# Patient Record
Sex: Male | Born: 1980
Health system: Southern US, Community
[De-identification: ages and names within clinical notes are randomized; demographics above are authoritative.]

## PROBLEM LIST (undated history)

## (undated) DIAGNOSIS — F419 Anxiety disorder, unspecified: Secondary | ICD-10-CM

## (undated) DIAGNOSIS — T7840XA Allergy, unspecified, initial encounter: Secondary | ICD-10-CM

## (undated) HISTORY — DX: Allergy, unspecified, initial encounter: T78.40XA

## (undated) HISTORY — PX: SPINE SURGERY: SHX786

## (undated) HISTORY — DX: Anxiety disorder, unspecified: F41.9

---

## 2003-04-15 ENCOUNTER — Emergency Department (HOSPITAL_COMMUNITY): Admission: EM | Admit: 2003-04-15 | Discharge: 2003-04-16 | Payer: Self-pay | Admitting: Emergency Medicine

## 2004-04-02 HISTORY — PX: OTHER SURGICAL HISTORY: SHX169

## 2011-05-19 ENCOUNTER — Ambulatory Visit (INDEPENDENT_AMBULATORY_CARE_PROVIDER_SITE_OTHER): Payer: 59 | Admitting: Internal Medicine

## 2011-05-19 DIAGNOSIS — R209 Unspecified disturbances of skin sensation: Secondary | ICD-10-CM

## 2011-05-19 DIAGNOSIS — R202 Paresthesia of skin: Secondary | ICD-10-CM

## 2011-05-19 DIAGNOSIS — M419 Scoliosis, unspecified: Secondary | ICD-10-CM
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
## 2012-02-29 ENCOUNTER — Encounter: Payer: Self-pay | Admitting: Emergency Medicine

## 2012-02-29 ENCOUNTER — Ambulatory Visit (INDEPENDENT_AMBULATORY_CARE_PROVIDER_SITE_OTHER): Payer: 59 | Admitting: Emergency Medicine

## 2012-02-29 VITALS — BP 135/77 | HR 65 | Temp 97.5°F | Resp 18 | Ht 71.5 in | Wt 156.0 lb

## 2012-02-29 DIAGNOSIS — J018 Other acute sinusitis: Secondary | ICD-10-CM

## 2012-02-29 MED ORDER — AMOXICILLIN-POT CLAVULANATE 875-125 MG PO TABS: 1.0000 | ORAL_TABLET | Freq: Two times a day (BID) | ORAL | Status: DC

## 2012-02-29 MED ORDER — PSEUDOEPHEDRINE-GUAIFENESIN ER 60-600 MG PO TB12: 1.0000 | ORAL_TABLET | Freq: Two times a day (BID) | ORAL | Status: DC

## 2012-02-29 NOTE — Progress Notes (Signed)
[facility] [address] [place] [address] [phone]  Date:  02/29/2012   Name:  Ryan Campos   DOB:  [birth year]   MRN:  [id number]  PCP:  No primary provider on file.    Chief Complaint: Sore Throat and Headache   History of Present Illness:  Ryan Campos is a 31 y.o. very pleasant male patient who presents with the following:  Ill for a week with a sore throat.  Developed pain in the ears and last night started to have a frontal headache that is much worse when he bends over, sneezes or coughs.  Has a green nasal drainage and a post nasal drip.  No cough or wheeze or shortness of breath.  Patient Active Problem List  Diagnosis  . Scoliosis deformity of spine    No past medical history on file.  Past Surgical History  Procedure Date  . Scoliosis 2006    rod    History  Substance Use Topics  . Smoking status: Never Smoker   . Smokeless tobacco: Not on file  . Alcohol Use: Not on file    No family history on file.  No Known Allergies  Medication list has been reviewed and updated.  No current outpatient prescriptions on file prior to visit.    Review of Systems:  As per HPI, otherwise negative.    Physical Examination: Filed Vitals:   02/29/12 1036  BP: 135/77  Pulse: 65  Temp: 97.5 F (36.4 C)  Resp: 18   Filed Vitals:   02/29/12 1036  Height: 5' 11.5" (1.816 m)  Weight: 156 lb (70.761 kg)   Body mass index is 21.45 kg/(m^2). Ideal Body Weight: Weight in (lb) to have BMI = 25: 181.4   GEN: WDWN, NAD, Non-toxic, A & O x 3.  No rash or sepsis or shortness of breath HEENT: Atraumatic, Normocephalic. Neck supple. No masses, No LAD.  Oropharynx negative Ears and Nose: No external deformity.  TM negative CV: RRR, No M/G/R. No JVD. No thrill. No extra heart sounds. PULM: CTA B, no wheezes, crackles, rhonchi. No retractions. No resp. distress. No accessory muscle use. ABD: S, NT, ND, +BS. No rebound. No HSM. EXTR: No  c/c/e NEURO Normal gait.  PSYCH: Normally interactive. Conversant. Not depressed or anxious appearing.  Calm demeanor.    Assessment and Plan: Sinusitis augmentin mucinex d Follow up as needed  Carmelina Dane, MD

## 2012-03-10 NOTE — Progress Notes (Signed)
Reviewed and agree.

## 2012-09-10 ENCOUNTER — Emergency Department (HOSPITAL_COMMUNITY)
Admission: EM | Admit: 2012-09-10 | Discharge: 2012-09-10 | Disposition: A | Discharge status: Home / Self Care | Payer: No Typology Code available for payment source | Attending: Emergency Medicine | Admitting: Emergency Medicine

## 2012-09-10 ENCOUNTER — Emergency Department (HOSPITAL_COMMUNITY): Payer: No Typology Code available for payment source

## 2012-09-10 ENCOUNTER — Encounter (HOSPITAL_COMMUNITY): Payer: Self-pay | Admitting: Physical Medicine and Rehabilitation

## 2012-09-10 DIAGNOSIS — S161XXA Strain of muscle, fascia and tendon at neck level, initial encounter: Secondary | ICD-10-CM

## 2012-09-10 DIAGNOSIS — Y92410 Unspecified street and highway as the place of occurrence of the external cause: Secondary | ICD-10-CM | POA: Insufficient documentation

## 2012-09-10 DIAGNOSIS — S20219A Contusion of unspecified front wall of thorax, initial encounter: Secondary | ICD-10-CM | POA: Insufficient documentation

## 2012-09-10 DIAGNOSIS — Y9389 Activity, other specified: Secondary | ICD-10-CM | POA: Insufficient documentation

## 2012-09-10 DIAGNOSIS — S20211A Contusion of right front wall of thorax, initial encounter: Secondary | ICD-10-CM

## 2012-09-10 DIAGNOSIS — S139XXA Sprain of joints and ligaments of unspecified parts of neck, initial encounter: Secondary | ICD-10-CM | POA: Insufficient documentation

## 2012-09-10 MED ORDER — CYCLOBENZAPRINE HCL 10 MG PO TABS: 10.0000 mg | ORAL_TABLET | Freq: Two times a day (BID) | ORAL | Status: DC | PRN

## 2012-09-10 MED ORDER — NAPROXEN 500 MG PO TABS: 500.0000 mg | ORAL_TABLET | Freq: Two times a day (BID) | ORAL | Status: DC

## 2012-09-10 NOTE — ED Notes (Signed)
c-collar applied in triage
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
## 2012-09-11 NOTE — ED Provider Notes (Signed)
Medical screening examination/treatment/procedure(s) were performed by non-physician practitioner and as supervising physician I was immediately available for consultation/collaboration.  Christopher J. Pollina, MD 09/11/12 0706 

## 2013-05-12 ENCOUNTER — Ambulatory Visit (INDEPENDENT_AMBULATORY_CARE_PROVIDER_SITE_OTHER): Payer: 59 | Admitting: Emergency Medicine

## 2013-05-12 VITALS — BP 108/70 | HR 75 | Temp 98.0°F | Resp 16 | Ht 70.25 in | Wt 164.4 lb

## 2013-05-12 DIAGNOSIS — R519 Headache, unspecified: Secondary | ICD-10-CM

## 2013-05-12 DIAGNOSIS — S139XXA Sprain of joints and ligaments of unspecified parts of neck, initial encounter: Secondary | ICD-10-CM

## 2013-05-12 DIAGNOSIS — R51 Headache: Secondary | ICD-10-CM

## 2013-05-12 MED ORDER — CYCLOBENZAPRINE HCL 10 MG PO TABS: 10.0000 mg | ORAL_TABLET | Freq: Two times a day (BID) | ORAL | Status: DC | PRN

## 2013-05-12 MED ORDER — MECLIZINE HCL 50 MG PO TABS: 50.0000 mg | ORAL_TABLET | Freq: Three times a day (TID) | ORAL | Status: DC | PRN

## 2013-05-12 MED ORDER — NAPROXEN SODIUM 550 MG PO TABS: 550.0000 mg | ORAL_TABLET | Freq: Two times a day (BID) | ORAL | Status: DC
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
## 2013-05-12 NOTE — Patient Instructions (Signed)
Cervical Sprain °A cervical sprain is when the tissues (ligaments) that hold the neck bones in place stretch or tear. °HOME CARE  °· Put ice on the injured area. °· Put ice in a plastic bag. °· Place a towel between your skin and the bag. °· Leave the ice on for 15 20 minutes, 3 4 times a day. °· You may have been given a collar to wear. This collar keeps your neck from moving while you heal. °· Do not take the collar off unless told by your doctor. °· If you have long hair, keep it outside of the collar. °· Ask your doctor before changing the position of your collar. You may need to change its position over time to make it more comfortable. °· If you are allowed to take off the collar for cleaning or bathing, follow your doctor's instructions on how to do it safely. °· Keep your collar clean by wiping it with mild soap and water. Dry it completely. If the collar has removable pads, remove them every 1 2 days to hand wash them with soap and water. Allow them to air dry. They should be dry before you wear them in the collar. °· Do not drive while wearing the collar. °· Only take medicine as told by your doctor. °· Keep all doctor visits as told. °· Keep all physical therapy visits as told. °· Adjust your work station so that you have good posture while you work. °· Avoid positions and activities that make your problems worse. °· Warm up and stretch before being active. °GET HELP IF: °· Your pain is not controlled with medicine. °· You cannot take less pain medicine over time as planned. °· Your activity level does not improve as expected. °GET HELP RIGHT AWAY IF:  °· You are bleeding. °· Your stomach is upset. °· You have an allergic reaction to your medicine. °· You develop new problems that you cannot explain. °· You lose feeling (become numb) or you cannot move any part of your body (paralysis). °· You have tingling or weakness in any part of your body. °· Your symptoms get worse. Symptoms include: °· Pain,  soreness, stiffness, puffiness (swelling), or a burning feeling in your neck. °· Pain when your neck is touched. °· Shoulder or upper back pain. °· Limited ability to move your neck. °· Headache. °· Dizziness. °· Your hands or arms feel week, lose feeling, or tingle. °· Muscle spasms. °· Difficulty swallowing or chewing. °MAKE SURE YOU:  °· Understand these instructions. °· Will watch your condition. °· Will get help right away if you are not doing well or get worse. °Document Released: 09/05/2007 Document Revised: 11/19/2012 Document Reviewed: 09/24/2012 °ExitCare® Patient Information ©2014 [employer]. ° °

## 2013-05-15 ENCOUNTER — Telehealth: Payer: Self-pay | Admitting: *Deleted

## 2013-05-15 DIAGNOSIS — S139XXA Sprain of joints and ligaments of unspecified parts of neck, initial encounter: Secondary | ICD-10-CM

## 2013-05-15 DIAGNOSIS — R51 Headache: Secondary | ICD-10-CM

## 2013-05-15 NOTE — Telephone Encounter (Signed)
Sure go ahead and sign it;

## 2013-05-15 NOTE — Telephone Encounter (Signed)
[facility] without contrast. Can we change order to MRI instead of CT?   I have pended order- Prior Berkley Harveyuth is [phone]

## 2013-05-16 NOTE — Telephone Encounter (Signed)
Completed.

## 2013-05-22 ENCOUNTER — Telehealth: Payer: Self-pay

## 2013-05-22 ENCOUNTER — Ambulatory Visit
Admission: RE | Admit: 2013-05-22 | Discharge: 2013-05-22 | Disposition: A | Discharge status: Home / Self Care | Payer: 59 | Source: Ambulatory Visit | Attending: Emergency Medicine | Admitting: Emergency Medicine

## 2013-05-22 DIAGNOSIS — R51 Headache: Secondary | ICD-10-CM

## 2013-05-22 DIAGNOSIS — S139XXA Sprain of joints and ligaments of unspecified parts of neck, initial encounter: Secondary | ICD-10-CM

## 2013-05-22 NOTE — Telephone Encounter (Signed)
Patient called requesting a medication he needs in order to have an MRI performed. Patient was referred for an MRI from our office. Patient does not know the name of the medication, but he stated that it is similar to Volume.    Thank You!!!

## 2013-05-25 NOTE — Telephone Encounter (Signed)
Dr Dareen PianoAnderson, this pt became claustrophobic during the scheduled MRI. Can you write pt a sedative to take prior to next schedule MRI?

## 2013-05-26 ENCOUNTER — Telehealth: Payer: Self-pay

## 2013-05-26 MED ORDER — LORAZEPAM 2 MG PO TABS: 2.0000 mg | ORAL_TABLET | Freq: Once | ORAL | Status: DC

## 2013-05-26 NOTE — Telephone Encounter (Signed)
PATIENT STATES SOMEONE FROM [facility] TRIED TO CALL HIM Tuesday, BUT DID NOT LEAVE A MESSAGE. IT MAY BE IN REFERENCE TO GETTING A SEDATIVE FOR HIM TO HAVE AN MRI. HE CANNOT GO INTO CLOSED-IN PLACES. BEST PHONE [phone] (CELL)  [facility]

## 2013-05-26 NOTE — Telephone Encounter (Signed)
Tried to leave a VM for pt, but the mailbox wasn't set up.

## 2013-05-26 NOTE — Telephone Encounter (Signed)
Prescription written  Will need to pick it up here

## 2013-05-26 NOTE — Telephone Encounter (Signed)
Faxed. Notified pt  

## 2013-05-27 NOTE — Telephone Encounter (Signed)
We called to notify pt his prescription was written. Needs to pick up here. No answer, unable to leave message. Will try again later

## 2013-05-31 ENCOUNTER — Ambulatory Visit
Admission: RE | Admit: 2013-05-31 | Discharge: 2013-05-31 | Disposition: A | Discharge status: Home / Self Care | Payer: 59 | Source: Ambulatory Visit | Attending: Emergency Medicine | Admitting: Emergency Medicine

## 2013-06-26 ENCOUNTER — Telehealth: Payer: Self-pay | Admitting: Family Medicine

## 2013-06-26 DIAGNOSIS — R51 Headache: Secondary | ICD-10-CM

## 2013-06-26 DIAGNOSIS — R42 Dizziness and giddiness: Secondary | ICD-10-CM

## 2013-06-26 NOTE — Telephone Encounter (Signed)
Still having the same symptoms:  Lightheaded, dizzy, heavy feeling in head,  a lot of pressure in head, intermittent headaches, no nausea or vomiting, no problem when walking. Denies syncope. Pt would like to know what the next step is, a CT or labs?  Please advise.
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
## 2013-06-30 NOTE — Telephone Encounter (Signed)
Spoke to pt . He is aware Referral has been made for neuro.

## 2013-07-21 ENCOUNTER — Ambulatory Visit: Payer: 59 | Admitting: Neurology

## 2013-07-21 DIAGNOSIS — Z0289 Encounter for other administrative examinations: Secondary | ICD-10-CM

## 2013-07-30 ENCOUNTER — Encounter: Payer: Self-pay | Admitting: Neurology

## 2013-07-30 ENCOUNTER — Ambulatory Visit (INDEPENDENT_AMBULATORY_CARE_PROVIDER_SITE_OTHER): Payer: 59 | Admitting: Neurology

## 2013-07-30 ENCOUNTER — Encounter (INDEPENDENT_AMBULATORY_CARE_PROVIDER_SITE_OTHER): Payer: Self-pay

## 2013-07-30 VITALS — BP 142/87 | HR 71 | Temp 98.2°F | Ht 72.0 in | Wt 170.0 lb

## 2013-07-30 DIAGNOSIS — S139XXA Sprain of joints and ligaments of unspecified parts of neck, initial encounter: Secondary | ICD-10-CM

## 2013-07-30 DIAGNOSIS — G44209 Tension-type headache, unspecified, not intractable: Secondary | ICD-10-CM | POA: Insufficient documentation

## 2013-07-30 DIAGNOSIS — R42 Dizziness and giddiness: Secondary | ICD-10-CM | POA: Insufficient documentation
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
## 2013-07-30 NOTE — Patient Instructions (Signed)
I had a long discussion with the patient with regards to his headache, neck pain and dizziness discussed plan for evaluation and answered questions. I recommend he do regular neck stretching exercises. Check MRA of the neck and brain to evaluate vertebral basilar. Return for followup in 2 months or call earlier if necessary.  Dizziness Dizziness is a common problem. It is a feeling of unsteadiness or lightheadedness. You may feel like you are about to faint. Dizziness can lead to injury if you stumble or fall. A person of any age group can suffer from dizziness, but dizziness is more common in older adults. CAUSES  Dizziness can be caused by many different things, including:  Middle ear problems.  Standing for too long.  Infections.  An allergic reaction.  Aging.  An emotional response to something, such as the sight of blood.  Side effects of medicines.  Fatigue.  Problems with circulation or blood pressure.  Excess use of alcohol, medicines, or illegal drug use.  Breathing too fast (hyperventilation).  An arrhythmia or problems with your heart rhythm.  Low red blood cell count (anemia).  Pregnancy.  Vomiting, diarrhea, fever, or other illnesses that cause dehydration.  Diseases or conditions such as Parkinson's disease, high blood pressure (hypertension), diabetes, and thyroid problems.  Exposure to extreme heat. DIAGNOSIS  To find the cause of your dizziness, your caregiver may do a physical exam, lab tests, radiologic imaging scans, or an electrocardiography test (ECG).  TREATMENT  Treatment of dizziness depends on the cause of your symptoms and can vary greatly. HOME CARE INSTRUCTIONS   Drink enough fluids to keep your urine clear or pale yellow. This is especially important in very hot weather. In the elderly, it is also important in cold weather.  If your dizziness is caused by medicines, take them exactly as directed. When taking blood pressure medicines, it is  especially important to get up slowly.  Rise slowly from chairs and steady yourself until you feel okay.  In the morning, first sit up on the side of the bed. When this seems okay, stand slowly while holding onto something until you know your balance is fine.  If you need to stand in one place for a long time, be sure to move your legs often. Tighten and relax the muscles in your legs while standing.  If dizziness continues to be a problem, have someone stay with you for a day or two. Do this until you feel you are well enough to stay alone. Have the person call your caregiver if he or she notices changes in you that are concerning.  Do not drive or use heavy machinery if you feel dizzy.  Do not drink alcohol. SEEK IMMEDIATE MEDICAL CARE IF:   Your dizziness or lightheadedness gets worse.  You feel nauseous or vomit.  You develop problems with talking, walking, weakness, or using your arms, hands, or legs.  You are not thinking clearly or you have difficulty forming sentences. It may take a friend or family member to determine if your thinking is normal.  You develop chest pain, abdominal pain, shortness of breath, or sweating.  Your vision changes.  You notice any bleeding.  You have side effects from medicine that seems to be getting worse rather than better. MAKE SURE YOU:   Understand these instructions.  Will watch your condition.  Will get help right away if you are not doing well or get worse. Document Released: 09/12/2000 Document Revised: 06/11/2011 Document Reviewed: 10/06/2010 [employer]  Patient Information 2014 [employer].  

## 2013-08-27 ENCOUNTER — Other Ambulatory Visit: Payer: 59

## 2013-11-04 ENCOUNTER — Telehealth: Payer: Self-pay | Admitting: Nurse Practitioner

## 2013-11-04 ENCOUNTER — Ambulatory Visit: Payer: 59 | Admitting: Nurse Practitioner

## 2013-11-04 NOTE — Telephone Encounter (Signed)
Patient was no show for today's office appointment.  

## 2013-11-18 ENCOUNTER — Encounter: Payer: Self-pay | Admitting: Nurse Practitioner

## 2014-04-30 ENCOUNTER — Ambulatory Visit (INDEPENDENT_AMBULATORY_CARE_PROVIDER_SITE_OTHER): Payer: 59 | Admitting: Family Medicine

## 2014-04-30 VITALS — BP 126/74 | HR 79 | Temp 98.4°F | Resp 17 | Ht 72.0 in | Wt 162.0 lb

## 2014-04-30 DIAGNOSIS — R0602 Shortness of breath: Secondary | ICD-10-CM

## 2014-04-30 DIAGNOSIS — R42 Dizziness and giddiness: Secondary | ICD-10-CM

## 2014-04-30 DIAGNOSIS — F411 Generalized anxiety disorder: Secondary | ICD-10-CM

## 2014-04-30 LAB — COMPREHENSIVE METABOLIC PANEL
ALT: 15 U/L (ref 0–53)
AST: 14 U/L (ref 0–37)
Albumin: 4.3 g/dL (ref 3.5–5.2)
Alkaline Phosphatase: 58 U/L (ref 39–117)
BILIRUBIN TOTAL: 0.9 mg/dL (ref 0.2–1.2)
BUN: 11 mg/dL (ref 6–23)
CALCIUM: 9.6 mg/dL (ref 8.4–10.5)
CO2: 25 mEq/L (ref 19–32)
Chloride: 104 mEq/L (ref 96–112)
Creat: 1.11 mg/dL (ref 0.50–1.35)
Glucose, Bld: 77 mg/dL (ref 70–99)
POTASSIUM: 4.8 meq/L (ref 3.5–5.3)
SODIUM: 140 meq/L (ref 135–145)
Total Protein: 6.7 g/dL (ref 6.0–8.3)

## 2014-04-30 LAB — TSH: TSH: 1.879 u[IU]/mL (ref 0.350–4.500)

## 2014-04-30 MED ORDER — SERTRALINE HCL 50 MG PO TABS: ORAL_TABLET | ORAL | Status: DC

## 2014-04-30 MED ORDER — ALPRAZOLAM 0.25 MG PO TABS: 0.2500 mg | ORAL_TABLET | Freq: Two times a day (BID) | ORAL | Status: DC | PRN
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
## 2014-04-30 NOTE — Patient Instructions (Signed)
We will let you know the results of your labs  Take sertraline 50 mg 1 each morning for anxiety. It will be about 2 weeks of being on this before you start feeling like you're not having as much of the anxiety and claustrophobic symptoms.  If you have a bad, take an alprazolam pill. This is best used only on an episodic basis, not for long-term use. I think that once the sertraline kicks and you will not need any of this.  Names of a couple of psychologists if you decide you need counseling  Wynelle Fannyhomas Hedding [phone] Walker Kehrareon Stewart [phone]  Return at anytime if needed

## 2014-05-02 ENCOUNTER — Encounter: Payer: Self-pay | Admitting: *Deleted

## 2016-12-10 DIAGNOSIS — H1089 Other conjunctivitis: Secondary | ICD-10-CM | POA: Diagnosis not present

## 2016-12-31 ENCOUNTER — Ambulatory Visit (INDEPENDENT_AMBULATORY_CARE_PROVIDER_SITE_OTHER): Payer: 59 | Admitting: Urgent Care

## 2016-12-31 ENCOUNTER — Encounter: Payer: Self-pay | Admitting: Urgent Care

## 2016-12-31 VITALS — BP 147/89 | HR 72 | Temp 97.5°F | Resp 18 | Ht 72.0 in | Wt 188.2 lb

## 2016-12-31 DIAGNOSIS — M76892 Other specified enthesopathies of left lower limb, excluding foot: Secondary | ICD-10-CM | POA: Diagnosis not present

## 2016-12-31 DIAGNOSIS — S76212A Strain of adductor muscle, fascia and tendon of left thigh, initial encounter: Secondary | ICD-10-CM

## 2016-12-31 MED ORDER — CELECOXIB 100 MG PO CAPS
100.0000 mg | ORAL_CAPSULE | Freq: Two times a day (BID) | ORAL | 1 refills | Status: DC
Start: 2016-12-31 — End: 2017-05-14
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
## 2016-12-31 NOTE — Patient Instructions (Signed)
Adductor Muscle Strain An adductor muscle strain, also called a groin strain or pull, is an injury to the muscles or tendon on the upper inner part of the thigh. These muscles are called the adductor muscles or groin muscles. They are responsible for moving the leg across the body or pulling the legs together. A muscle strain occurs when a muscle is overstretched and some muscle fibers are torn. An adductor muscle strain can range from mild to severe, depending on how many muscle fibers are affected and whether the muscle fibers are partially or completely torn. Adductor muscle strains usually occur during exercise or participation in sports. The injury often happens when a sudden, violent force is placed on a muscle, stretching the muscle too far. A strain is more likely to occur when your muscles are not warmed up or if you are not properly conditioned. Depending on the severity of the muscle strain, recovery time may vary from a few weeks to several weeks. Severe injuries often require 4-6 weeks for recovery. In those cases, complete healing can take 4-5 months. What are the causes? This injury may be caused by:  Stretching the adductor muscles too far or too suddenly, often during side-to-side motion with an abrupt change in direction.  Putting repeated stress on the adductor muscles over a long period of time.  Performing vigorous activity without properly stretching the adductor muscles beforehand.  What are the signs or symptoms? Symptoms of this condition include:  Pain and tenderness in the groin area. This begins as sharp pain and persists as a dull ache.  A popping or snapping feeling when the injury occurs (for severe strains).  Swelling or bruising.  Muscle spasms.  Weakness in the leg.  Stiffness in the groin area with decreased ability to move the affected muscles.  How is this diagnosed? This condition may be diagnosed based on your symptoms, your description of how the  injury occurred, and a physical exam. X-rays are sometimes needed to rule out a broken bone or cartilage problems. A CT scan or MRI may be done if your health care provider suspects a complete muscle tear or needs to check for other injuries. How is this treated? An adductor strain will often heal on its own. Typically, treatment will involve protecting the injured area, rest, ice, pressure (compression), and elevation. This is often called PRICE therapy. Your health care provider may also prescribe medicines to help manage pain and swelling (anti-inflammatory medicine). You may be told to use crutches for the first few days to minimize your pain. Follow these instructions at home: PRICE Therapy  Protect the muscle from being injured again.  Rest. Do not use the strained muscle if it causes pain.  If directed, apply ice to the injured area: ? Put ice in a plastic bag. ? Place a towel between your skin and the bag. ? Leave the ice on for 20 minutes, 2-3 times a day. Do this for the first 2 days after the injury.  Apply compression by wrapping the injured area with an elastic bandage as told by your health care provider.  Raise (elevate) the injured area above the level of your heart while you are sitting or lying down. General instructions  Take over-the-counter and prescription medicines only as told by your health care provider.  Walk, stretch, and do exercises as told by your health care provider. Only do these activities if you can do so without any pain. How is this prevented?  Warm up   and stretch before being active.  Cool down and stretch after being active.  Give your body time to rest between periods of activity.  Make sure to use equipment that fits you.  Be safe and responsible while being active to avoid slips and falls.  Maintain physical fitness, including: ? Proper conditioning in the adductor muscles. ? Overall strength, flexibility, and endurance. Contact a  health care provider if:  You have increased pain or swelling in the affected area.  Your symptoms are not improving or they are getting worse. This information is not intended to replace advice given to you by your health care provider. Make sure you discuss any questions you have with your health care provider. Document Released: 11/15/2003 Document Revised: 10/07/2015 Document Reviewed: 12/21/2014 Elsevier Interactive Patient Education  2018 [employer]     IF you received an x-ray today, you will receive an invoice from [facility]. Please contact [facility] at [phone] with questions or concerns regarding your invoice.   IF you received labwork today, you will receive an invoice from [facility]. Please contact [facility] at [phone] with questions or concerns regarding your invoice.   Our billing staff will not be able to assist you with questions regarding bills from these companies.  You will be contacted with the lab results as soon as they are available. The fastest way to get your results is to activate your My Chart account. Instructions are located on the last page of this paperwork. If you have not heard from us regarding the results in 2 weeks, please contact this office.      

## 2017-01-08 ENCOUNTER — Ambulatory Visit (INDEPENDENT_AMBULATORY_CARE_PROVIDER_SITE_OTHER): Payer: 59

## 2017-01-08 ENCOUNTER — Encounter: Payer: Self-pay | Admitting: Urgent Care

## 2017-01-08 ENCOUNTER — Ambulatory Visit (INDEPENDENT_AMBULATORY_CARE_PROVIDER_SITE_OTHER): Payer: 59 | Admitting: Urgent Care

## 2017-01-08 VITALS — BP 128/80 | HR 57 | Temp 97.8°F | Resp 17 | Ht 72.0 in | Wt 188.0 lb

## 2017-01-08 DIAGNOSIS — M76892 Other specified enthesopathies of left lower limb, excluding foot: Secondary | ICD-10-CM

## 2017-01-08 DIAGNOSIS — S76212D Strain of adductor muscle, fascia and tendon of left thigh, subsequent encounter: Secondary | ICD-10-CM

## 2017-01-08 DIAGNOSIS — M25552 Pain in left hip: Secondary | ICD-10-CM

## 2017-01-08 MED ORDER — CYCLOBENZAPRINE HCL 5 MG PO TABS: 5.0000 mg | ORAL_TABLET | Freq: Three times a day (TID) | ORAL | 1 refills | Status: DC | PRN

## 2017-01-08 NOTE — Progress Notes (Signed)
    MRN: [id number] DOB: [birth year]  Subjective:   Ryan Campos is a 36 y.o. male presenting for follow up on groin pain. Patient has had improvement with work restrictions, using celecoxib but still has intermittent sharp pains and new hip pain lateral to his groin pain on left side. Denies trauma, lifting heavy items, squatting excessively.   Ryan Campos has a current medication list which includes the following prescription(s): alprazolam, celecoxib, and sertraline. Also is allergic to seasonal ic [cholestatin].  Ryan Campos  has a past medical history of Allergy and Anxiety. Also  has a past surgical history that includes scoliosis (2006) and Spine surgery.  Objective:   Vitals: BP 128/80   Pulse (!) 57   Temp 97.8 F (36.6 C) (Oral)   Resp 17   Ht 6' (1.829 m)   Wt 188 lb (85.3 kg)   SpO2 98%   BMI 25.50 kg/m   Physical Exam  Constitutional: He is oriented to person, place, and time. He appears well-developed and well-nourished.  Cardiovascular: Normal rate.   Pulmonary/Chest: Effort normal.  Musculoskeletal:       Left hip: He exhibits tenderness (with ROM and strength testing over areas depicted). He exhibits normal range of motion, normal strength, no bony tenderness, no swelling, no crepitus, no deformity and no laceration.       Legs: Neurological: He is alert and oriented to person, place, and time.   Dg Hip Unilat W Or W/o Pelvis 2-3 Views Left  Result Date: 01/08/2017 CLINICAL DATA:  Left hip pain.  No known injury. EXAM: DG HIP (WITH OR WITHOUT PELVIS) 2-3V LEFT COMPARISON:  No recent prior . FINDINGS: No acute bony or joint abnormality identified. No evidence of fracture or dislocation. IMPRESSION: No acute abnormality identified. Electronically Signed   By: Maisie Fus  Register   On: 01/08/2017 09:51   Assessment and Plan :   1. Groin strain, left, subsequent encounter 2. Tendinitis of left hip adductor muscle 3. Left hip pain - Maintain celecoxib, add Flexeril. Maintain work  restrictions. Start physical therapy. Follow up in 2 weeks if PT has not yet started.  Wallis Bamberg, PA-C [facility] [phone] 01/08/2017 9:20 AM

## 2017-01-08 NOTE — Patient Instructions (Addendum)
Maintain celecoxib, add Flexeril. Maintain work restrictions. Start physical therapy. Follow up in 2 weeks if PT has not yet started.    Adductor Muscle Strain An adductor muscle strain, also called a groin strain or pull, is an injury to the muscles or tendon on the upper inner part of the thigh. These muscles are called the adductor muscles or groin muscles. They are responsible for moving the leg across the body or pulling the legs together. A muscle strain occurs when a muscle is overstretched and some muscle fibers are torn. An adductor muscle strain can range from mild to severe, depending on how many muscle fibers are affected and whether the muscle fibers are partially or completely torn. Adductor muscle strains usually occur during exercise or participation in sports. The injury often happens when a sudden, violent force is placed on a muscle, stretching the muscle too far. A strain is more likely to occur when your muscles are not warmed up or if you are not properly conditioned. Depending on the severity of the muscle strain, recovery time may vary from a few weeks to several weeks. Severe injuries often require 4-6 weeks for recovery. In those cases, complete healing can take 4-5 months. What are the causes? This injury may be caused by:  Stretching the adductor muscles too far or too suddenly, often during side-to-side motion with an abrupt change in direction.  Putting repeated stress on the adductor muscles over a long period of time.  Performing vigorous activity without properly stretching the adductor muscles beforehand.  What are the signs or symptoms? Symptoms of this condition include:  Pain and tenderness in the groin area. This begins as sharp pain and persists as a dull ache.  A popping or snapping feeling when the injury occurs (for severe strains).  Swelling or bruising.  Muscle spasms.  Weakness in the leg.  Stiffness in the groin area with decreased ability to  move the affected muscles.  How is this diagnosed? This condition may be diagnosed based on your symptoms, your description of how the injury occurred, and a physical exam. X-rays are sometimes needed to rule out a broken bone or cartilage problems. A CT scan or MRI may be done if your health care provider suspects a complete muscle tear or needs to check for other injuries. How is this treated? An adductor strain will often heal on its own. Typically, treatment will involve protecting the injured area, rest, ice, pressure (compression), and elevation. This is often called PRICE therapy. Your health care provider may also prescribe medicines to help manage pain and swelling (anti-inflammatory medicine). You may be told to use crutches for the first few days to minimize your pain. Follow these instructions at home: PRICE Therapy  Protect the muscle from being injured again.  Rest. Do not use the strained muscle if it causes pain.  If directed, apply ice to the injured area: ? Put ice in a plastic bag. ? Place a towel between your skin and the bag. ? Leave the ice on for 20 minutes, 2-3 times a day. Do this for the first 2 days after the injury.  Apply compression by wrapping the injured area with an elastic bandage as told by your health care provider.  Raise (elevate) the injured area above the level of your heart while you are sitting or lying down. General instructions  Take over-the-counter and prescription medicines only as told by your health care provider.  Walk, stretch, and do exercises as told  by your health care provider. Only do these activities if you can do so without any pain. How is this prevented?  Warm up and stretch before being active.  Cool down and stretch after being active.  Give your body time to rest between periods of activity.  Make sure to use equipment that fits you.  Be safe and responsible while being active to avoid slips and falls.  Maintain  physical fitness, including: ? Proper conditioning in the adductor muscles. ? Overall strength, flexibility, and endurance. Contact a health care provider if:  You have increased pain or swelling in the affected area.  Your symptoms are not improving or they are getting worse. This information is not intended to replace advice given to you by your health care provider. Make sure you discuss any questions you have with your health care provider. Document Released: 11/15/2003 Document Revised: 10/07/2015 Document Reviewed: 12/21/2014 Elsevier Interactive Patient Education  2018 [employer].     IF you received an x-ray today, you will receive an invoice from [facility]. Please contact [facility] at [phone] with questions or concerns regarding your invoice.   IF you received labwork today, you will receive an invoice from [facility]. Please contact [facility] at [phone] with questions or concerns regarding your invoice.   Our billing staff will not be able to assist you with questions regarding bills from these companies.  You will be contacted with the lab results as soon as they are available. The fastest way to get your results is to activate your My Chart account. Instructions are located on the last page of this paperwork. If you have not heard from [place] regarding the results in 2 weeks, please contact this office.

## 2017-01-24 ENCOUNTER — Ambulatory Visit: Payer: 59 | Admitting: Urgent Care

## 2017-05-05 DIAGNOSIS — K5900 Constipation, unspecified: Secondary | ICD-10-CM | POA: Diagnosis not present

## 2017-05-05 DIAGNOSIS — K529 Noninfective gastroenteritis and colitis, unspecified: Secondary | ICD-10-CM | POA: Diagnosis not present

## 2017-05-05 DIAGNOSIS — Z981 Arthrodesis status: Secondary | ICD-10-CM | POA: Diagnosis not present

## 2017-05-05 DIAGNOSIS — R1031 Right lower quadrant pain: Secondary | ICD-10-CM | POA: Diagnosis not present

## 2017-05-06 DIAGNOSIS — Z981 Arthrodesis status: Secondary | ICD-10-CM | POA: Diagnosis not present

## 2017-05-06 DIAGNOSIS — R1031 Right lower quadrant pain: Secondary | ICD-10-CM | POA: Diagnosis not present

## 2017-05-08 DIAGNOSIS — R1031 Right lower quadrant pain: Secondary | ICD-10-CM | POA: Diagnosis not present

## 2017-05-08 DIAGNOSIS — K659 Peritonitis, unspecified: Secondary | ICD-10-CM | POA: Diagnosis not present

## 2017-05-14 ENCOUNTER — Ambulatory Visit: Payer: 59 | Admitting: Urgent Care

## 2017-05-14 ENCOUNTER — Encounter: Payer: Self-pay | Admitting: Urgent Care

## 2017-05-14 VITALS — BP 128/82 | HR 72 | Temp 98.5°F | Resp 16 | Ht 72.0 in | Wt 190.0 lb

## 2017-05-14 DIAGNOSIS — R1031 Right lower quadrant pain: Secondary | ICD-10-CM | POA: Diagnosis not present

## 2017-05-14 DIAGNOSIS — R109 Unspecified abdominal pain: Secondary | ICD-10-CM

## 2017-05-14 DIAGNOSIS — K6389 Other specified diseases of intestine: Secondary | ICD-10-CM | POA: Insufficient documentation

## 2017-05-14 DIAGNOSIS — K529 Noninfective gastroenteritis and colitis, unspecified: Secondary | ICD-10-CM | POA: Diagnosis not present

## 2017-05-14 LAB — POCT CBC
GRANULOCYTE PERCENT: 61.7 % (ref 37–80)
HCT, POC: 51.3 % (ref 43.5–53.7)
Hemoglobin: 16.5 g/dL (ref 14.1–18.1)
Lymph, poc: 1.9 (ref 0.6–3.4)
MCH: 29.5 pg (ref 27–31.2)
MCHC: 32.2 g/dL (ref 31.8–35.4)
MCV: 91.6 fL (ref 80–97)
MID (CBC): 0.3 (ref 0–0.9)
MPV: 8.4 fL (ref 0–99.8)
PLATELET COUNT, POC: 303 10*3/uL (ref 142–424)
POC Granulocyte: 3.6 (ref 2–6.9)
POC LYMPH %: 32.8 % (ref 10–50)
POC MID %: 5.5 %M (ref 0–12)
RBC: 5.6 M/uL (ref 4.69–6.13)
RDW, POC: 12 %
WBC: 5.8 10*3/uL (ref 4.6–10.2)

## 2017-05-14 MED ORDER — DOCUSATE SODIUM 50 MG PO CAPS: 50.0000 mg | ORAL_CAPSULE | Freq: Two times a day (BID) | ORAL | 0 refills | Status: DC

## 2017-05-14 NOTE — Progress Notes (Signed)
MRN: [id number] DOB: Feb 15, 1981  Subjective:   Ryan Campos is a 37 y.o. male presenting for persistent abdominal pain. Patient was seen twice this month at [facility] ER. He had an abdominal CT on 05/06/2017 that showed, "1. Suspected epiploic appendagitis of the sigmoid colon. 2. Mild dilatation of the base of the appendix with a small amount of periappendiceal stranding and fluid. There is gas within the nondilated tip of the appendix. The findings could represent early acute appendicitis although patient's pain could be explained by the epiploic appendagitis. Clinical correlation and close clinical followup and consideration of follow-up CT in 24 hours recommended if patient's symptoms persist." CBC has been 8.8, 9.6 on 05/06/2017. He was advised to use Miralax and follow up with PCP in 2 days if symptoms persist. Today, he reports ongoing RLQ pain, right groin/pelvic pain. Pain is dull, achy, intermittent, has associated constipation. Has had some relief with Miralax, was using ibuprofen 1-3 times daily, is now using just 200mg  daily. Admits about 50% improvement. Denies fever, n/v, bloody stools. Has been eating his regular diet, hydrating well, eating fiber including salads. Denies smoking cigarettes.  Stacie has a current medication list which includes the following prescription(s): ibuprofen, polyethylene glycol 3350, sertraline, and alprazolam. Also is allergic to seasonal ic [cholestatin].  Tucker  has a past medical history of Allergy and Anxiety. Also  has a past surgical history that includes scoliosis (2006) and Spine surgery.  Objective:   Vitals: BP 128/82   Pulse 72   Temp 98.5 F (36.9 C) (Oral)   Resp 16   Ht 6' (1.829 m)   Wt 190 lb (86.2 kg)   SpO2 95%   BMI 25.77 kg/m   Physical Exam  Constitutional: He is oriented to person, place, and time. He appears well-developed and well-nourished.  HENT:  Mouth/Throat: Oropharynx is clear and moist.  Cardiovascular: Normal rate, regular  rhythm and intact distal pulses. Exam reveals no gallop and no friction rub.  No murmur heard. Pulmonary/Chest: No respiratory distress. He has no wheezes. He has no rales.  Abdominal: Soft. Bowel sounds are normal. He exhibits no distension and no mass. There is no tenderness. There is no guarding.  Neurological: He is alert and oriented to person, place, and time.  Skin: Skin is warm and dry.  Psychiatric: He has a normal mood and affect.   Results for orders placed or performed in visit on 05/14/17 (from the past 24 hour(s))  POCT CBC     Status: None   Collection Time: 05/14/17  4:35 PM  Result Value Ref Range   WBC 5.8 4.6 - 10.2 K/uL   Lymph, poc 1.9 0.6 - 3.4   POC LYMPH PERCENT 32.8 10 - 50 %L   MID (cbc) 0.3 0 - 0.9   POC MID % 5.5 0 - 12 %M   POC Granulocyte 3.6 2 - 6.9   Granulocyte percent 61.7 37 - 80 %G   RBC 5.60 4.69 - 6.13 M/uL   Hemoglobin 16.5 14.1 - 18.1 g/dL   HCT, POC 40.9 81.1 - 53.7 %   MCV 91.6 80 - 97 fL   MCH, POC 29.5 27 - 31.2 pg   MCHC 32.2 31.8 - 35.4 g/dL   RDW, POC 91.4 %   Platelet Count, POC 303 142 - 424 K/uL   MPV 8.4 0 - 99.8 fL    Assessment and Plan :   Abdominal pain, unspecified abdominal location - Plan: POCT CBC, Ambulatory referral to Gastroenterology, Comprehensive  metabolic panel  RLQ abdominal pain - Plan: Ambulatory referral to Gastroenterology, Comprehensive metabolic panel  Epiploic appendagitis - Plan: Ambulatory referral to Gastroenterology, Comprehensive metabolic panel  Will refer to GI for further work up. CBC and physical exam reassuring. Labs pending. Patient is to continue to   Wallis BambergMario Hernandez [place], [facility] [phone] 05/14/2017  4:09 PM

## 2017-05-14 NOTE — Patient Instructions (Addendum)
Please use Miralax for moderate to severe constipation. Take this once a day for the next 2-3 days. Please also start docusate stool softener, twice a day for at least 1 week. If stools become loose, cut down to once a day for another week. If stools remain loose, cut back to 1 pill every other day for a third week. You can stop docusate thereafter and resume as needed for constipation.  To help reduce constipation and promote bowel health: 1. Drink at least 64 ounces of water each day 2. Eat plenty of fiber (fruits, vegetables, whole grains, legumes) 3. Be physically active or exercise including walking, jogging, swimming, yoga, etc. 4. For active constipation use a stool softener (docusate) or an osmotic laxative (like Miralax) each day, or as needed.      Abdominal Pain, Adult Abdominal pain can be caused by many things. Often, abdominal pain is not serious and it gets better with no treatment or by being treated at home. However, sometimes abdominal pain is serious. Your health care provider will do a medical history and a physical exam to try to determine the cause of your abdominal pain. Follow these instructions at home:  Take over-the-counter and prescription medicines only as told by your health care provider. Do not take a laxative unless told by your health care provider.  Drink enough fluid to keep your urine clear or pale yellow.  Watch your condition for any changes.  Keep all follow-up visits as told by your health care provider. This is important. Contact a health care provider if:  Your abdominal pain changes or gets worse.  You are not hungry or you lose weight without trying.  You are constipated or have diarrhea for more than 2-3 days.  You have pain when you urinate or have a bowel movement.  Your abdominal pain wakes you up at night.  Your pain gets worse with meals, after eating, or with certain foods.  You are throwing up and cannot keep anything  down.  You have a fever. Get help right away if:  Your pain does not go away as soon as your health care provider told you to expect.  You cannot stop throwing up.  Your pain is only in areas of the abdomen, such as the right side or the left lower portion of the abdomen.  You have bloody or black stools, or stools that look like tar.  You have severe pain, cramping, or bloating in your abdomen.  You have signs of dehydration, such as: ? Dark urine, very little urine, or no urine. ? Cracked lips. ? Dry mouth. ? Sunken eyes. ? Sleepiness. ? Weakness. This information is not intended to replace advice given to you by your health care provider. Make sure you discuss any questions you have with your health care provider. Document Released: 12/27/2004 Document Revised: 10/07/2015 Document Reviewed: 08/31/2015 Elsevier Interactive Patient Education  [employer]2018 Elsevier Inc.

## 2017-05-15 LAB — COMPREHENSIVE METABOLIC PANEL
ALT: 16 IU/L (ref 0–44)
AST: 13 IU/L (ref 0–40)
Albumin/Globulin Ratio: 1.9 (ref 1.2–2.2)
Albumin: 5 g/dL (ref 3.5–5.5)
Alkaline Phosphatase: 63 IU/L (ref 39–117)
BUN/Creatinine Ratio: 11 (ref 9–20)
BUN: 14 mg/dL (ref 6–20)
Bilirubin Total: 0.9 mg/dL (ref 0.0–1.2)
CALCIUM: 10 mg/dL (ref 8.7–10.2)
CO2: 23 mmol/L (ref 20–29)
CREATININE: 1.26 mg/dL (ref 0.76–1.27)
Chloride: 102 mmol/L (ref 96–106)
GFR calc Af Amer: 84 mL/min/{1.73_m2} (ref >59)
GFR, EST NON AFRICAN AMERICAN: 73 mL/min/{1.73_m2} (ref >59)
GLOBULIN, TOTAL: 2.6 g/dL (ref 1.5–4.5)
GLUCOSE: 95 mg/dL (ref 65–99)
Potassium: 4.3 mmol/L (ref 3.5–5.2)
Sodium: 143 mmol/L (ref 134–144)
Total Protein: 7.6 g/dL (ref 6.0–8.5)

## 2017-05-22 ENCOUNTER — Ambulatory Visit: Payer: Self-pay | Admitting: Family Medicine

## 2017-05-24 ENCOUNTER — Telehealth: Payer: Self-pay

## 2017-05-24 NOTE — Telephone Encounter (Signed)
Copied from CRM [id number]. Topic: Inquiry >> May 24, 2017 11:13 AM Alexander BergeronBarksdale, Harvey B wrote: Reason for CRM: pt was seen last Tuesday w/ Urban GibsonMani and is needing another note for work for light restricted work b/c he's still having pain and he wants to take it light until he sees the Gi, contact pt to advise

## 2017-05-24 NOTE — Telephone Encounter (Signed)
Phone call to patient to clarify request. He states he w ould like a work note for his second job at [employer]. Would like work note to say to excuse him from work [employer] managertonight- he does not work until next week. Discussed with patient he may need to come in for an additional visit for any changes to work note, he states he does not want to spend the 20 dollars to be seen for a work note.   Provider, ok to write note excusing him from work tonight? Please advise.

## 2017-05-24 NOTE — Telephone Encounter (Signed)
Patient came in to clinic. Work note provided to be excused from work today until he gets his consult with GI doctor. Further restrictions will be decided at that point by his GI doctor.

## 2017-05-27 ENCOUNTER — Ambulatory Visit: Payer: Self-pay | Admitting: Family Medicine

## 2017-05-27 DIAGNOSIS — Z0289 Encounter for other administrative examinations: Secondary | ICD-10-CM

## 2017-05-29 DIAGNOSIS — R1031 Right lower quadrant pain: Secondary | ICD-10-CM | POA: Diagnosis not present

## 2017-05-29 DIAGNOSIS — R933 Abnormal findings on diagnostic imaging of other parts of digestive tract: Secondary | ICD-10-CM | POA: Diagnosis not present

## 2017-10-30 DIAGNOSIS — M25551 Pain in right hip: Secondary | ICD-10-CM | POA: Diagnosis not present

## 2017-10-30 DIAGNOSIS — R1031 Right lower quadrant pain: Secondary | ICD-10-CM | POA: Diagnosis not present

## 2018-03-03 DIAGNOSIS — R1011 Right upper quadrant pain: Secondary | ICD-10-CM | POA: Diagnosis not present

## 2018-03-03 DIAGNOSIS — R1013 Epigastric pain: Secondary | ICD-10-CM | POA: Diagnosis not present

## 2018-03-03 DIAGNOSIS — R140 Abdominal distension (gaseous): Secondary | ICD-10-CM | POA: Diagnosis not present

## 2018-11-06 ENCOUNTER — Other Ambulatory Visit: Payer: Self-pay

## 2018-11-06 DIAGNOSIS — Z20822 Contact with and (suspected) exposure to covid-19: Secondary | ICD-10-CM

## 2018-11-07 LAB — NOVEL CORONAVIRUS, NAA: SARS-CoV-2, NAA: NOT DETECTED

## 2019-02-20 ENCOUNTER — Other Ambulatory Visit: Payer: Self-pay

## 2019-02-20 DIAGNOSIS — Z20822 Contact with and (suspected) exposure to covid-19: Secondary | ICD-10-CM

## 2019-02-22 LAB — NOVEL CORONAVIRUS, NAA: SARS-CoV-2, NAA: NOT DETECTED

## 2020-04-29 ENCOUNTER — Ambulatory Visit: Payer: 59 | Admitting: Family Medicine

## 2020-04-29 ENCOUNTER — Other Ambulatory Visit: Payer: Self-pay

## 2020-04-29 ENCOUNTER — Encounter: Payer: Self-pay | Admitting: Family Medicine

## 2020-04-29 VITALS — BP 173/83 | HR 90 | Temp 98.0°F | Resp 18 | Ht 72.0 in | Wt 207.2 lb

## 2020-04-29 DIAGNOSIS — R030 Elevated blood-pressure reading, without diagnosis of hypertension: Secondary | ICD-10-CM

## 2020-04-29 DIAGNOSIS — M546 Pain in thoracic spine: Secondary | ICD-10-CM | POA: Diagnosis not present

## 2020-04-29 DIAGNOSIS — F411 Generalized anxiety disorder: Secondary | ICD-10-CM | POA: Diagnosis not present

## 2020-04-29 DIAGNOSIS — M41114 Juvenile idiopathic scoliosis, thoracic region: Secondary | ICD-10-CM | POA: Diagnosis not present

## 2020-04-29 MED ORDER — DICLOFENAC SODIUM 75 MG PO TBEC: 75.0000 mg | DELAYED_RELEASE_TABLET | Freq: Two times a day (BID) | ORAL | 0 refills | Status: DC

## 2020-04-29 MED ORDER — CYCLOBENZAPRINE HCL 10 MG PO TABS: 10.0000 mg | ORAL_TABLET | Freq: Three times a day (TID) | ORAL | 0 refills | Status: AC | PRN

## 2020-04-29 NOTE — Progress Notes (Signed)
Patient ID: Ryan Campos, male    DOB: 05/05/1980  Age: 40 y.o. MRN: [id number]  Chief Complaint  Patient presents with  . Back Pain    Patient states since last Wednesday he has been having some back pain that feels like a piched nerve and states he has been taking ibuprofen with no change in the pain.   . Anxiety    Patient states his anxiety is starting to come and go again. He would like a referral instead of medication.    Subjective:   Patient is here for 2 things.  He has been having trouble with pain in his back.  He has history of problems intermittently but over the last couple weeks his had a lot of pain.  Especially about 10 days.  Started in his neck and shoulders.  Then it went down the sides of his back.  He has history of having had scoliosis surgery when he was young.  He does several jobs which aggravate things also.  No acute injury.  He also has been having problems with anxiety.  His problems come and go.  He would like referral.  We talked about psychiatrist versus counselor and he would prefer not to end up on medicine yet so we decided to refer him on to counseling.  Current allergies, medications, problem list, past/family and social histories reviewed.  Objective:  BP (!) 173/83   Pulse 90   Temp 98 F (36.7 C) (Temporal)   Resp 18   Ht 6' (1.829 m)   Wt 207 lb 3.2 oz (94 kg)   SpO2 98%   BMI 28.10 kg/m  Mild tenderness especially between the scapula and is mid back.  Limited side to side tilt from the Harrington rods.  Is able to flex at the low back but no flexion of the thoracic spine.  I imagine he is prone to back pains from having the fused back.  We discussed his mild blood pressure elevation.  He is very anxious today.  Assessment & Plan:   Assessment: 1. Acute bilateral thoracic back pain   2. Juvenile idiopathic scoliosis of thoracic region   3. Anxiety state   4. Elevated blood pressure reading       Plan: He is to get his blood pressure  checked elsewhere.  I think he is just anxious and transiently elevated today.  Suggested some psychologist for him.  See instructions and medication orders.  No orders of the defined types were placed in this encounter.   Meds ordered this encounter  Medications  . cyclobenzaprine (FLEXERIL) 10 MG tablet    Sig: Take 1 tablet (10 mg total) by mouth 3 (three) times daily as needed for muscle spasms.    Dispense:  30 tablet    Refill:  0  . diclofenac (VOLTAREN) 75 MG EC tablet    Sig: Take 1 tablet (75 mg total) by mouth 2 (two) times daily.    Dispense:  30 tablet    Refill:  0         Patient Instructions   Take cyclobenzaprine 10 mg 1 pill 2-3 times daily if needed for muscle relaxant.  It will cause drowsiness, so some people just take it at bedtime.  Take diclofenac 75 mg 1 twice daily for anti-inflammatory pain relief.  (Do not take ibuprofen or Aleve while on this because they are in the same class of medicine).  Take the diclofenac with food to avoid stomach irritation.  In  addition to these you can take acetaminophen (Tylenol) 500 mg 2 pills 3 times daily as needed for pain.  For your anxiety I recommend that you see a counselor to work through Product manager of your anxiety before you pursue medications.  The following to men are people who have so of my patients well in the past.  You can call and try again appointment.  They also may have some other systems working with them.  [facility] [address] [place], [place] [address] [phone]  Cline Cools, Ph.D. [phone]  As you work on finding a new family physician, you can look at the [facility] health family medicine groups, [facility] our family medicine groups, and [facility].  All agreed starting places here in [place].  If you have lab work done today you will be contacted with your lab results within the next 2 weeks.  If you have not heard from [place] then please contact us. The  fastest way to get your results is to register for My Chart.   IF you received an x-ray today, you will receive an invoice from [facility]. Please contact [facility] at [phone] with questions or concerns regarding your invoice.   IF you received labwork today, you will receive an invoice from [facility]. Please contact [facility] at [phone] with questions or concerns regarding your invoice.   Our billing staff will not be able to assist you with questions regarding bills from these companies.  You will be contacted with the lab results as soon as they are available. The fastest way to get your results is to activate your My Chart account. Instructions are located on the last page of this paperwork. If you have not heard from [place] regarding the results in 2 weeks, please contact this office.         Return if symptoms worsen or fail to improve.   Janace Hoard, MD 04/29/2020

## 2020-04-29 NOTE — Patient Instructions (Addendum)
Take cyclobenzaprine 10 mg 1 pill 2-3 times daily if needed for muscle relaxant.  It will cause drowsiness, so some people just take it at bedtime.  Take diclofenac 75 mg 1 twice daily for anti-inflammatory pain relief.  (Do not take ibuprofen or Aleve while on this because they are in the same class of medicine).  Take the diclofenac with food to avoid stomach irritation.  In addition to these you can take acetaminophen (Tylenol) 500 mg 2 pills 3 times daily as needed for pain.  For your anxiety I recommend that you see a counselor to work through Product manager of your anxiety before you pursue medications.  The following to men are people who have so of my patients well in the past.  You can call and try again appointment.  They also may have some other systems working with them.  [facility] [address] [place], [place] [address] [phone]  Cline Cools, Ph.D. [phone]  As you work on finding a new family physician, you can look at the [facility] health family medicine groups, [facility] our family medicine groups, and [facility].  All agreed starting places here in [place].  If you have lab work done today you will be contacted with your lab results within the next 2 weeks.  If you have not heard from [place] then please contact us. The fastest way to get your results is to register for My Chart.   IF you received an x-ray today, you will receive an invoice from [facility]. Please contact [facility] at [phone] with questions or concerns regarding your invoice.   IF you received labwork today, you will receive an invoice from [facility]. Please contact [facility] at [phone] with questions or concerns regarding your invoice.   Our billing staff will not be able to assist you with questions regarding bills from these companies.  You will be contacted with the lab results as soon as they are available. The fastest way to get your results  is to activate your My Chart account. Instructions are located on the last page of this paperwork. If you have not heard from [place] regarding the results in 2 weeks, please contact this office.

## 2020-07-15 ENCOUNTER — Ambulatory Visit: Payer: Self-pay | Admitting: Family

## 2020-07-18 NOTE — Progress Notes (Signed)
Patient did not show for appointment.   

## 2020-07-19 ENCOUNTER — Encounter: Payer: Self-pay | Admitting: Family

## 2020-07-19 DIAGNOSIS — Z7689 Persons encountering health services in other specified circumstances: Secondary | ICD-10-CM
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
## 2020-09-19 ENCOUNTER — Other Ambulatory Visit: Payer: Self-pay

## 2020-09-19 ENCOUNTER — Telehealth (INDEPENDENT_AMBULATORY_CARE_PROVIDER_SITE_OTHER): Payer: 59 | Admitting: Family

## 2020-09-19 DIAGNOSIS — M419 Scoliosis, unspecified: Secondary | ICD-10-CM

## 2020-09-19 DIAGNOSIS — F411 Generalized anxiety disorder: Secondary | ICD-10-CM

## 2020-09-19 DIAGNOSIS — Z7689 Persons encountering health services in other specified circumstances: Secondary | ICD-10-CM

## 2020-09-19 MED ORDER — SERTRALINE HCL 25 MG PO TABS: 25.0000 mg | ORAL_TABLET | Freq: Every day | ORAL | 0 refills | Status: DC

## 2020-10-18 ENCOUNTER — Other Ambulatory Visit: Payer: Self-pay

## 2020-10-18 ENCOUNTER — Other Ambulatory Visit (HOSPITAL_COMMUNITY)
Admission: RE | Admit: 2020-10-18 | Discharge: 2020-10-18 | Disposition: A | Discharge status: Home / Self Care | Payer: 59 | Source: Ambulatory Visit | Attending: Family | Admitting: Family

## 2020-10-18 ENCOUNTER — Ambulatory Visit (INDEPENDENT_AMBULATORY_CARE_PROVIDER_SITE_OTHER): Payer: 59 | Admitting: Family

## 2020-10-18 ENCOUNTER — Encounter: Payer: Self-pay | Admitting: Family

## 2020-10-18 VITALS — BP 138/95 | HR 70 | Temp 98.1°F | Resp 17 | Ht 72.01 in | Wt 197.8 lb

## 2020-10-18 DIAGNOSIS — Z1159 Encounter for screening for other viral diseases: Secondary | ICD-10-CM

## 2020-10-18 DIAGNOSIS — Z131 Encounter for screening for diabetes mellitus: Secondary | ICD-10-CM

## 2020-10-18 DIAGNOSIS — Z130 Encounter for screening for diseases of the blood and blood-forming organs and certain disorders involving the immune mechanism: Secondary | ICD-10-CM

## 2020-10-18 DIAGNOSIS — Z13228 Encounter for screening for other metabolic disorders: Secondary | ICD-10-CM

## 2020-10-18 DIAGNOSIS — Z1329 Encounter for screening for other suspected endocrine disorder: Secondary | ICD-10-CM

## 2020-10-18 DIAGNOSIS — Z113 Encounter for screening for infections with a predominantly sexual mode of transmission: Secondary | ICD-10-CM

## 2020-10-18 DIAGNOSIS — Z114 Encounter for screening for human immunodeficiency virus [HIV]: Secondary | ICD-10-CM

## 2020-10-18 DIAGNOSIS — F411 Generalized anxiety disorder: Secondary | ICD-10-CM

## 2020-10-18 DIAGNOSIS — Z0000 Encounter for general adult medical examination without abnormal findings: Secondary | ICD-10-CM

## 2020-10-18 DIAGNOSIS — Z13220 Encounter for screening for lipoid disorders: Secondary | ICD-10-CM

## 2020-10-18 DIAGNOSIS — F40243 Fear of flying: Secondary | ICD-10-CM

## 2020-10-18 DIAGNOSIS — R030 Elevated blood-pressure reading, without diagnosis of hypertension: Secondary | ICD-10-CM | POA: Diagnosis not present

## 2020-10-18 MED ORDER — HYDROXYZINE PAMOATE 25 MG PO CAPS: 25.0000 mg | ORAL_CAPSULE | Freq: Three times a day (TID) | ORAL | 1 refills | Status: DC | PRN
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
## 2020-10-18 NOTE — Patient Instructions (Signed)
Preventive Care 40-40 Years Old, Male Preventive care refers to lifestyle choices and visits with your health care provider that can promote health and wellness. This includes: A yearly physical exam. This is also called an annual wellness visit. Regular dental and eye exams. Immunizations. Screening for certain conditions. Healthy lifestyle choices, such as: Eating a healthy diet. Getting regular exercise. Not using drugs or products that contain nicotine and tobacco. Limiting alcohol use. What can I expect for my preventive care visit? Physical exam Your health care provider will check your: Height and weight. These may be used to calculate your BMI (body mass index). BMI is a measurement that tells if you are at a healthy weight. Heart rate and blood pressure. Body temperature. Skin for abnormal spots. Counseling Your health care provider may ask you questions about your: Past medical problems. Family's medical history. Alcohol, tobacco, and drug use. Emotional well-being. Home life and relationship well-being. Sexual activity. Diet, exercise, and sleep habits. Work and work environment. Access to firearms. What immunizations do I need?  Vaccines are usually given at various ages, according to a schedule. Your health care provider will recommend vaccines for you based on your age, medicalhistory, and lifestyle or other factors, such as travel or where you work. What tests do I need? Blood tests Lipid and cholesterol levels. These may be checked every 5 years, or more often if you are over 50 years old. Hepatitis C test. Hepatitis B test. Screening Lung cancer screening. You may have this screening every year starting at age 55 if you have a 30-pack-year history of smoking and currently smoke or have quit within the past 15 years. Prostate cancer screening. Recommendations will vary depending on your family history and other risks. Genital exam to check for testicular cancer  or hernias. Colorectal cancer screening. All adults should have this screening starting at age 50 and continuing until age 75. Your health care provider may recommend screening at age 45 if you are at increased risk. You will have tests every 1-10 years, depending on your results and the type of screening test. Diabetes screening. This is done by checking your blood sugar (glucose) after you have not eaten for a while (fasting). You may have this done every 1-3 years. STD (sexually transmitted disease) testing, if you are at risk. Follow these instructions at home: Eating and drinking  Eat a diet that includes fresh fruits and vegetables, whole grains, lean protein, and low-fat dairy products. Take vitamin and mineral supplements as recommended by your health care provider. Do not drink alcohol if your health care provider tells you not to drink. If you drink alcohol: Limit how much you have to 0-2 drinks a day. Be aware of how much alcohol is in your drink. In the [place] one drink equals one 12 oz bottle of beer (355 mL), one 5 oz glass of wine (148 mL), or one 1 oz glass of hard liquor (44 mL).  Lifestyle Take daily care of your teeth and gums. Brush your teeth every morning and night with fluoride toothpaste. Floss one time each day. Stay active. Exercise for at least 30 minutes 5 or more days each week. Do not use any products that contain nicotine or tobacco, such as cigarettes, e-cigarettes, and chewing tobacco. If you need help quitting, ask your health care provider. Do not use drugs. If you are sexually active, practice safe sex. Use a condom or other form of protection to prevent STIs (sexually transmitted infections). If told by   your health care provider, take low-dose aspirin daily starting at age 50. Find healthy ways to cope with stress, such as: Meditation, yoga, or listening to music. Journaling. Talking to a trusted person. Spending time with friends and  family. Safety Always wear your seat belt while driving or riding in a vehicle. Do not drive: If you have been drinking alcohol. Do not ride with someone who has been drinking. When you are tired or distracted. While texting. Wear a helmet and other protective equipment during sports activities. If you have firearms in your house, make sure you follow all gun safety procedures. What's next? Go to your health care provider once a year for an annual wellness visit. Ask your health care provider how often you should have your eyes and teeth checked. Stay up to date on all vaccines. This information is not intended to replace advice given to you by your health care provider. Make sure you discuss any questions you have with your healthcare provider. Document Revised: 12/16/2018 Document Reviewed: 03/13/2018 Elsevier Patient Education  2022 Elsevier Inc.  

## 2020-10-18 NOTE — Progress Notes (Signed)
Pt present for annual physical exam desires STI screening  Dealing with anxiety thinks why BP stays elevated

## 2020-10-19 ENCOUNTER — Encounter: Payer: Self-pay | Admitting: Family

## 2020-10-19 DIAGNOSIS — R7303 Prediabetes: Secondary | ICD-10-CM | POA: Insufficient documentation

## 2020-10-19 LAB — CBC
Hematocrit: 45.9 % (ref 37.5–51.0)
Hemoglobin: 15.9 g/dL (ref 13.0–17.7)
MCH: 30.8 pg (ref 26.6–33.0)
MCHC: 34.6 g/dL (ref 31.5–35.7)
MCV: 89 fL (ref 79–97)
Platelets: 328 10*3/uL (ref 150–450)
RBC: 5.17 x10E6/uL (ref 4.14–5.80)
RDW: 12.3 % (ref 11.6–15.4)
WBC: 4.8 10*3/uL (ref 3.4–10.8)

## 2020-10-19 LAB — URINE CYTOLOGY ANCILLARY ONLY
Bacterial Vaginitis-Urine: NEGATIVE
Candida Urine: NEGATIVE
Chlamydia: NEGATIVE
Comment: NEGATIVE
Comment: NEGATIVE
Comment: NORMAL
Neisseria Gonorrhea: NEGATIVE
Trichomonas: NEGATIVE

## 2020-10-19 LAB — LIPID PANEL
Chol/HDL Ratio: 1.8 ratio (ref 0.0–5.0)
Cholesterol, Total: 129 mg/dL (ref 100–199)
HDL: 72 mg/dL (ref >39)
LDL Chol Calc (NIH): 45 mg/dL (ref 0–99)
Triglycerides: 56 mg/dL (ref 0–149)
VLDL Cholesterol Cal: 12 mg/dL (ref 5–40)

## 2020-10-19 LAB — CMP14+EGFR
ALT: 29 IU/L (ref 0–44)
AST: 25 IU/L (ref 0–40)
Albumin/Globulin Ratio: 1.8 (ref 1.2–2.2)
Albumin: 4.9 g/dL (ref 4.0–5.0)
Alkaline Phosphatase: 73 IU/L (ref 44–121)
BUN/Creatinine Ratio: 9 (ref 9–20)
BUN: 10 mg/dL (ref 6–24)
Bilirubin Total: 1.4 mg/dL — ABNORMAL HIGH (ref 0.0–1.2)
CO2: 22 mmol/L (ref 20–29)
Calcium: 9.7 mg/dL (ref 8.7–10.2)
Chloride: 100 mmol/L (ref 96–106)
Creatinine, Ser: 1.08 mg/dL (ref 0.76–1.27)
Globulin, Total: 2.7 g/dL (ref 1.5–4.5)
Glucose: 97 mg/dL (ref 65–99)
Potassium: 4.1 mmol/L (ref 3.5–5.2)
Sodium: 139 mmol/L (ref 134–144)
Total Protein: 7.6 g/dL (ref 6.0–8.5)
eGFR: 89 mL/min/{1.73_m2} (ref >59)

## 2020-10-19 LAB — TSH: TSH: 0.855 u[IU]/mL (ref 0.450–4.500)

## 2020-10-19 LAB — HIV ANTIBODY (ROUTINE TESTING W REFLEX): HIV Screen 4th Generation wRfx: NONREACTIVE

## 2020-10-19 LAB — HEMOGLOBIN A1C
Est. average glucose Bld gHb Est-mCnc: 117 mg/dL
Hgb A1c MFr Bld: 5.7 % — ABNORMAL HIGH (ref 4.8–5.6)

## 2020-10-19 LAB — HEPATITIS C ANTIBODY: Hep C Virus Ab: 0.2 s/co ratio (ref 0.0–0.9)

## 2020-10-19 NOTE — Progress Notes (Signed)
Gonorrhea, Chlamydia, Trichomonas negative.

## 2020-10-19 NOTE — Progress Notes (Signed)
Kidney function normal.   Liver function normal.   Thyroid function normal.   Cholesterol normal.   No anemia.  Hepatitis C negative.   HIV negative.   Hemoglobin A1c is consistent with pre-diabetes. Practice healthy eating habits of fresh fruit and vegetables, lean baked meats such as chicken, fish, and [place]; limit breads, rice, pastas, and desserts; practice regular aerobic exercise (at least 150 minutes a week as tolerated). No medication needed at the moment. Encouraged to recheck in 6 months.

## 2020-10-26 ENCOUNTER — Ambulatory Visit: Payer: 59 | Admitting: Orthopaedic Surgery

## 2020-11-04 ENCOUNTER — Telehealth (INDEPENDENT_AMBULATORY_CARE_PROVIDER_SITE_OTHER): Payer: 59 | Admitting: Psychiatry

## 2020-11-04 ENCOUNTER — Encounter (HOSPITAL_COMMUNITY): Payer: Self-pay | Admitting: Psychiatry

## 2020-11-04 DIAGNOSIS — F40240 Claustrophobia: Secondary | ICD-10-CM

## 2020-11-04 DIAGNOSIS — F411 Generalized anxiety disorder: Secondary | ICD-10-CM

## 2020-11-04 MED ORDER — SERTRALINE HCL 25 MG PO TABS: 25.0000 mg | ORAL_TABLET | Freq: Every day | ORAL | 0 refills | Status: DC
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
## 2020-11-14 ENCOUNTER — Telehealth: Payer: Self-pay | Admitting: Family

## 2020-11-14 NOTE — Telephone Encounter (Signed)
Pt would need to contact Behavioral Health provider to see if note can be provided. Pt completed video visit with them on 08/05 in regards to his claustrophobia.
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
## 2020-11-22 NOTE — Progress Notes (Signed)
Virtual Visit via Telephone Note  I connected with Ryan Campos, on 11/23/2020 at 3:50 PM by telephone due to the COVID-19 pandemic and verified that I am speaking with the correct person using two identifiers.  Due to current restrictions/limitations of in-office visits due to the COVID-19 pandemic, this scheduled clinical appointment was converted to a telehealth visit.   Consent: I discussed the limitations, risks, security and privacy concerns of performing an evaluation and management service by telephone and the availability of in person appointments. I also discussed with the patient that there may be a patient responsible charge related to this service. The patient expressed understanding and agreed to proceed.   Location of Patient: Home  Location of Provider: [facility]   Persons participating in Telemedicine visit: Ryan Campos [name], NP Margorie John, CMA   History of Present Illness: Ryan Campos is a 40 year-old male who presents for anxiety follow-up.   ANXIETY FOLLOW-UP: 10/18/2020: - Per patient preference Sertraline discontinued.  - Begin Hydroxyzine as prescribed.   11/23/2020: Doing well on Sertraline. Has noticed improvement in anxiety. No issues/concerns. Was unable to pick up Hydroxyzine since last visit doesn't feel he needs it at the moment. Denies thoughts of self-harm, suicidal ideations, and homicidal ideations.    Past Medical History:  Diagnosis Date   Allergy    Anxiety    Allergies  Allergen Reactions   Seasonal Ic [Cholestatin]     Running nose    Current Outpatient Medications on File Prior to Visit  Medication Sig Dispense Refill   cyclobenzaprine (FLEXERIL) 10 MG tablet Take 1 tablet (10 mg total) by mouth 3 (three) times daily as needed for muscle spasms. 30 tablet 0   hydrOXYzine (VISTARIL) 25 MG capsule Take 1 capsule (25 mg total) by mouth every 8 (eight) hours as needed. 30 capsule 1    sertraline (ZOLOFT) 25 MG tablet Take 1 tablet (25 mg total) by mouth daily. Delete prior refill 30 tablet 0   No current facility-administered medications on file prior to visit.    Observations/Objective: Alert and oriented x 3. Not in acute distress. Physical examination not completed as this is a telemedicine visit.  Assessment and Plan: 1. Generalized anxiety disorder: - Patient denies thoughts of self-harm, suicidal ideations, and homicidal ideations.  - Continue Sertraline as prescribed.  - Follow-up with primary provider in 3 months or sooner if needed.  - sertraline (ZOLOFT) 25 MG tablet; Take 1 tablet (25 mg total) by mouth daily. Delete prior refill  Dispense: 90 tablet; Refill: 0   Follow Up Instructions: Follow-up with primary provider in 3 months or sooner if needed.    Patient was given clear instructions to go to Emergency Department or return to medical center if symptoms don't improve, worsen, or new problems develop.The patient verbalized understanding.  I discussed the assessment and treatment plan with the patient. The patient was provided an opportunity to ask questions and all were answered. The patient agreed with the plan and demonstrated an understanding of the instructions.   The patient was advised to call back or seek an in-person evaluation if the symptoms worsen or if the condition fails to improve as anticipated.    I provided 10 minutes total of non-face-to-face time during this encounter.   Rema Fendt, NP  [facility] at [facility] [place], [place] [phone] 11/22/2020, 8:55 PM

## 2020-11-23 ENCOUNTER — Other Ambulatory Visit: Payer: Self-pay

## 2020-11-23 ENCOUNTER — Telehealth (INDEPENDENT_AMBULATORY_CARE_PROVIDER_SITE_OTHER): Payer: Self-pay | Admitting: Family

## 2020-11-23 DIAGNOSIS — F411 Generalized anxiety disorder: Secondary | ICD-10-CM

## 2020-11-23 MED ORDER — SERTRALINE HCL 25 MG PO TABS: 25.0000 mg | ORAL_TABLET | Freq: Every day | ORAL | 0 refills | Status: DC

## 2020-11-23 NOTE — Progress Notes (Signed)
Pt presents for telemedicine visit for anxiety. 

## 2020-12-02 ENCOUNTER — Telehealth (HOSPITAL_COMMUNITY): Payer: Self-pay | Admitting: Psychiatry

## 2021-01-03 ENCOUNTER — Other Ambulatory Visit: Payer: Self-pay | Admitting: Family

## 2021-01-03 ENCOUNTER — Telehealth: Payer: Self-pay | Admitting: Family

## 2021-01-03 DIAGNOSIS — F40243 Fear of flying: Secondary | ICD-10-CM

## 2021-01-03 MED ORDER — DIAZEPAM 5 MG PO TABS: ORAL_TABLET | ORAL | 0 refills | Status: DC

## 2021-01-03 NOTE — Telephone Encounter (Signed)
Diazepam (Valium) ordered per patient request of anxiety with flying. Please note no refills allowed.

## 2021-01-03 NOTE — Telephone Encounter (Signed)
URGENT refill request:  Pt unsure of name of medication but needs medication to board airplane for anxiety.  sertraline (ZOLOFT) 25 MG tablet [[id number]] hydrOXYzine (VISTARIL) 25 MG capsule [[id number]]   Pharmacy  [id number] [place], [place] - [address] AT [facility] [address]  [address] [place], [place] [address]  Phone:  [phone]  Fax:  [phone]

## 2021-01-03 NOTE — Telephone Encounter (Signed)
Per Cicero Duck, patient came into the office earlier today for this request.  Pls send medication for anxiety if applicable  He is leaving tomorrow.

## 2021-01-03 NOTE — Progress Notes (Signed)
I did check the [place] prescription drug database and found no frequent prescribers of opiates or evidence of aberrant behavior.   

## 2021-02-17 NOTE — Progress Notes (Signed)
Erroneous encounter

## 2021-02-20 ENCOUNTER — Encounter: Payer: Self-pay | Admitting: Family

## 2021-02-20 ENCOUNTER — Other Ambulatory Visit: Payer: Self-pay

## 2021-02-20 DIAGNOSIS — F411 Generalized anxiety disorder: Secondary | ICD-10-CM

## 2021-02-25 NOTE — Progress Notes (Signed)
Erroneous encounter

## 2021-03-03 ENCOUNTER — Other Ambulatory Visit: Payer: Self-pay

## 2021-03-03 ENCOUNTER — Encounter: Payer: Self-pay | Admitting: Family

## 2021-03-03 DIAGNOSIS — F411 Generalized anxiety disorder: Secondary | ICD-10-CM

## 2021-04-02 DIAGNOSIS — M545 Low back pain, unspecified: Secondary | ICD-10-CM | POA: Diagnosis not present

## 2021-04-02 DIAGNOSIS — G8911 Acute pain due to trauma: Secondary | ICD-10-CM | POA: Diagnosis not present

## 2021-04-02 DIAGNOSIS — S299XXA Unspecified injury of thorax, initial encounter: Secondary | ICD-10-CM | POA: Diagnosis not present

## 2021-04-02 DIAGNOSIS — M546 Pain in thoracic spine: Secondary | ICD-10-CM | POA: Diagnosis not present

## 2021-05-05 NOTE — Progress Notes (Signed)
Erroneous encounter

## 2021-05-11 ENCOUNTER — Encounter: Payer: Self-pay | Admitting: Family

## 2021-05-11 DIAGNOSIS — F411 Generalized anxiety disorder: Secondary | ICD-10-CM

## 2021-11-20 DIAGNOSIS — M25511 Pain in right shoulder: Secondary | ICD-10-CM | POA: Diagnosis not present

## 2021-11-20 DIAGNOSIS — M7541 Impingement syndrome of right shoulder: Secondary | ICD-10-CM | POA: Diagnosis not present

## 2021-11-20 DIAGNOSIS — R030 Elevated blood-pressure reading, without diagnosis of hypertension: Secondary | ICD-10-CM | POA: Diagnosis not present

## 2021-11-21 NOTE — Progress Notes (Signed)
Erroneous encounter-disregard

## 2021-11-29 ENCOUNTER — Encounter: Payer: Self-pay | Admitting: Family
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
## 2021-12-01 ENCOUNTER — Ambulatory Visit: Payer: BC Managed Care – PPO | Admitting: Family

## 2021-12-01 ENCOUNTER — Encounter: Payer: Self-pay | Admitting: Family

## 2021-12-01 ENCOUNTER — Telehealth: Payer: Self-pay | Admitting: Family

## 2021-12-01 VITALS — BP 129/87 | HR 73 | Temp 98.3°F | Resp 16 | Ht 72.01 in | Wt 198.0 lb

## 2021-12-01 DIAGNOSIS — F419 Anxiety disorder, unspecified: Secondary | ICD-10-CM

## 2021-12-01 DIAGNOSIS — R030 Elevated blood-pressure reading, without diagnosis of hypertension: Secondary | ICD-10-CM

## 2021-12-01 DIAGNOSIS — R079 Chest pain, unspecified: Secondary | ICD-10-CM | POA: Diagnosis not present

## 2021-12-01 DIAGNOSIS — F32A Depression, unspecified: Secondary | ICD-10-CM | POA: Diagnosis not present

## 2021-12-01 MED ORDER — ONDANSETRON HCL 4 MG PO TABS: 4.0000 mg | ORAL_TABLET | Freq: Three times a day (TID) | ORAL | 1 refills | Status: AC | PRN

## 2021-12-01 MED ORDER — ESCITALOPRAM OXALATE 10 MG PO TABS: 10.0000 mg | ORAL_TABLET | Freq: Every day | ORAL | 1 refills | Status: AC

## 2021-12-01 MED ORDER — HYDROXYZINE PAMOATE 25 MG PO CAPS: 25.0000 mg | ORAL_CAPSULE | Freq: Three times a day (TID) | ORAL | 1 refills | Status: AC | PRN

## 2021-12-01 NOTE — Progress Notes (Signed)
Pt present for blood pressure check -anxiety increasing per pt

## 2021-12-01 NOTE — Patient Instructions (Signed)
Hypertension, Adult High blood pressure (hypertension) is when the force of blood pumping through the arteries is too strong. The arteries are the blood vessels that carry blood from the heart throughout the body. Hypertension forces the heart to work harder to pump blood and may cause arteries to become narrow or stiff. Untreated or uncontrolled hypertension can lead to a heart attack, heart failure, a stroke, kidney disease, and other problems. A blood pressure reading consists of a higher number over a lower number. Ideally, your blood pressure should be below 120/80. The first ("top") number is called the systolic pressure. It is a measure of the pressure in your arteries as your heart beats. The second ("bottom") number is called the diastolic pressure. It is a measure of the pressure in your arteries as the heart relaxes. What are the causes? The exact cause of this condition is not known. There are some conditions that result in high blood pressure. What increases the risk? Certain factors may make you more likely to develop high blood pressure. Some of these risk factors are under your control, including: Smoking. Not getting enough exercise or physical activity. Being overweight. Having too much fat, sugar, calories, or salt (sodium) in your diet. Drinking too much alcohol. Other risk factors include: Having a personal history of heart disease, diabetes, high cholesterol, or kidney disease. Stress. Having a family history of high blood pressure and high cholesterol. Having obstructive sleep apnea. Age. The risk increases with age. What are the signs or symptoms? High blood pressure may not cause symptoms. Very high blood pressure (hypertensive crisis) may cause: Headache. Fast or irregular heartbeats (palpitations). Shortness of breath. Nosebleed. Nausea and vomiting. Vision changes. Severe chest pain, dizziness, and seizures. How is this diagnosed? This condition is diagnosed by  measuring your blood pressure while you are seated, with your arm resting on a flat surface, your legs uncrossed, and your feet flat on the floor. The cuff of the blood pressure monitor will be placed directly against the skin of your upper arm at the level of your heart. Blood pressure should be measured at least twice using the same arm. Certain conditions can cause a difference in blood pressure between your right and left arms. If you have a high blood pressure reading during one visit or you have normal blood pressure with other risk factors, you may be asked to: Return on a different day to have your blood pressure checked again. Monitor your blood pressure at home for 1 week or longer. If you are diagnosed with hypertension, you may have other blood or imaging tests to help your health care provider understand your overall risk for other conditions. How is this treated? This condition is treated by making healthy lifestyle changes, such as eating healthy foods, exercising more, and reducing your alcohol intake. You may be referred for counseling on a healthy diet and physical activity. Your health care provider may prescribe medicine if lifestyle changes are not enough to get your blood pressure under control and if: Your systolic blood pressure is above 130. Your diastolic blood pressure is above 80. Your personal target blood pressure may vary depending on your medical conditions, your age, and other factors. Follow these instructions at home: Eating and drinking  Eat a diet that is high in fiber and potassium, and low in sodium, added sugar, and fat. An example of this eating plan is called the DASH diet. DASH stands for Dietary Approaches to Stop Hypertension. To eat this way: Eat   plenty of fresh fruits and vegetables. Try to fill one half of your plate at each meal with fruits and vegetables. Eat whole grains, such as whole-wheat pasta, brown rice, or whole-grain bread. Fill about one  fourth of your plate with whole grains. Eat or drink low-fat dairy products, such as skim milk or low-fat yogurt. Avoid fatty cuts of meat, processed or cured meats, and poultry with skin. Fill about one fourth of your plate with lean proteins, such as fish, chicken without skin, beans, eggs, or tofu. Avoid pre-made and processed foods. These tend to be higher in sodium, added sugar, and fat. Reduce your daily sodium intake. Many people with hypertension should eat less than 1,500 mg of sodium a day. Do not drink alcohol if: Your health care provider tells you not to drink. You are pregnant, may be pregnant, or are planning to become pregnant. If you drink alcohol: Limit how much you have to: 0-1 drink a day for women. 0-2 drinks a day for men. Know how much alcohol is in your drink. In the [place] one drink equals one 12 oz bottle of beer (355 mL), one 5 oz glass of wine (148 mL), or one 1 oz glass of hard liquor (44 mL). Lifestyle  Work with your health care provider to maintain a healthy body weight or to lose weight. Ask what an ideal weight is for you. Get at least 30 minutes of exercise that causes your heart to beat faster (aerobic exercise) most days of the week. Activities may include walking, swimming, or biking. Include exercise to strengthen your muscles (resistance exercise), such as Pilates or lifting weights, as part of your weekly exercise routine. Try to do these types of exercises for 30 minutes at least 3 days a week. Do not use any products that contain nicotine or tobacco. These products include cigarettes, chewing tobacco, and vaping devices, such as e-cigarettes. If you need help quitting, ask your health care provider. Monitor your blood pressure at home as told by your health care provider. Keep all follow-up visits. This is important. Medicines Take over-the-counter and prescription medicines only as told by your health care provider. Follow directions carefully. Blood  pressure medicines must be taken as prescribed. Do not skip doses of blood pressure medicine. Doing this puts you at risk for problems and can make the medicine less effective. Ask your health care provider about side effects or reactions to medicines that you should watch for. Contact a health care provider if you: Think you are having a reaction to a medicine you are taking. Have headaches that keep coming back (recurring). Feel dizzy. Have swelling in your ankles. Have trouble with your vision. Get help right away if you: Develop a severe headache or confusion. Have unusual weakness or numbness. Feel faint. Have severe pain in your chest or abdomen. Vomit repeatedly. Have trouble breathing. These symptoms may be an emergency. Get help right away. Call 911. Do not wait to see if the symptoms will go away. Do not drive yourself to the hospital. Summary Hypertension is when the force of blood pumping through your arteries is too strong. If this condition is not controlled, it may put you at risk for serious complications. Your personal target blood pressure may vary depending on your medical conditions, your age, and other factors. For most people, a normal blood pressure is less than 120/80. Hypertension is treated with lifestyle changes, medicines, or a combination of both. Lifestyle changes include losing weight, eating a healthy,   low-sodium diet, exercising more, and limiting alcohol. This information is not intended to replace advice given to you by your health care provider. Make sure you discuss any questions you have with your health care provider. Document Revised: 01/24/2021 Document Reviewed: 01/24/2021 Elsevier Patient Education  2023 Elsevier Inc.  

## 2021-12-15 NOTE — Progress Notes (Signed)
Erroneous encounter-disregard

## 2021-12-19 ENCOUNTER — Institutional Professional Consult (permissible substitution): Payer: Self-pay | Admitting: Licensed Clinical Social Worker

## 2021-12-19 ENCOUNTER — Telehealth: Payer: Self-pay | Admitting: Licensed Clinical Social Worker

## 2021-12-19 NOTE — Telephone Encounter (Signed)
Copied from [place] [phone]. Topic: Appointment Scheduling - Scheduling Inquiry for Clinic >> Dec 19, 2021 10:28 AM Leitha Schuller wrote: Pt requesting to r/s 9-19 appt w/ Shann Medal  Please assist further

## 2021-12-27 NOTE — Telephone Encounter (Signed)
LCSWA returned patients' call, in regards to rescheduling his appointment. Patient needed to cancel his appointment last week. Patient is now scheduled for 01/02/22.

## 2021-12-29 ENCOUNTER — Encounter: Payer: BC Managed Care – PPO | Admitting: Family

## 2022-01-02 ENCOUNTER — Institutional Professional Consult (permissible substitution): Payer: Self-pay | Admitting: Licensed Clinical Social Worker

## 2022-01-03 NOTE — Progress Notes (Deleted)
Cardiology Office Note   Date:  01/03/2022   ID:  Ryan Campos, DOB 01-Jul-1980, MRN [id number]  PCP:  Camillia Herter, NP  Cardiologist:   None Referring:  ***  No chief complaint on file.     History of Present Illness: Ryan Campos is a 41 y.o. male who presents for ***     Past Medical History:  Diagnosis Date   Allergy    Anxiety     Past Surgical History:  Procedure Laterality Date   scoliosis  2006   rod   SPINE SURGERY       Current Outpatient Medications  Medication Sig Dispense Refill   cyclobenzaprine (FLEXERIL) 10 MG tablet Take 1 tablet (10 mg total) by mouth 3 (three) times daily as needed for muscle spasms. 30 tablet 0   escitalopram (LEXAPRO) 10 MG tablet Take 1 tablet (10 mg total) by mouth daily. 30 tablet 1   hydrOXYzine (VISTARIL) 25 MG capsule Take 1 capsule (25 mg total) by mouth every 8 (eight) hours as needed. 30 capsule 1   ondansetron (ZOFRAN) 4 MG tablet Take 1 tablet (4 mg total) by mouth every 8 (eight) hours as needed for nausea or vomiting. 30 tablet 1   No current facility-administered medications for this visit.    Allergies:   Seasonal ic [cholestatin]    Social History:  The patient  reports that he has never smoked. He has never been exposed to tobacco smoke. He has never used smokeless tobacco. He reports current alcohol use. He reports that he does not use drugs.   Family History:  The patient's ***family history is not on file.    ROS:  Please see the history of present illness.   Otherwise, review of systems are positive for {NONE DEFAULTED:18576}.   All other systems are reviewed and negative.    PHYSICAL EXAM: VS:  There were no vitals taken for this visit. , BMI There is no height or weight on file to calculate BMI. GENERAL:  Well appearing HEENT:  Pupils equal round and reactive, fundi not visualized, oral mucosa unremarkable NECK:  No jugular venous distention, waveform within normal limits, carotid upstroke brisk  and symmetric, no bruits, no thyromegaly LYMPHATICS:  No cervical, inguinal adenopathy LUNGS:  Clear to auscultation bilaterally BACK:  No CVA tenderness CHEST:  Unremarkable HEART:  PMI not displaced or sustained,S1 and S2 within normal limits, no S3, no S4, no clicks, no rubs, *** murmurs ABD:  Flat, positive bowel sounds normal in frequency in pitch, no bruits, no rebound, no guarding, no midline pulsatile mass, no hepatomegaly, no splenomegaly EXT:  2 plus pulses throughout, no edema, no cyanosis no clubbing SKIN:  No rashes no nodules NEURO:  Cranial nerves II through XII grossly intact, motor grossly intact throughout PSYCH:  Cognitively intact, oriented to person place and time    EKG:  EKG {ACTION; IS/IS BSJ:[id number]} ordered today. The ekg ordered today demonstrates ***   Recent Labs: No results found for requested labs within last 365 days.    Lipid Panel    Component Value Date/Time   CHOL 129 10/18/2020 1216   TRIG 56 10/18/2020 1216   HDL 72 10/18/2020 1216   CHOLHDL 1.8 10/18/2020 1216   LDLCALC 45 10/18/2020 1216      Wt Readings from Last 3 Encounters:  12/01/21 198 lb (89.8 kg)  10/18/20 197 lb 12.8 oz (89.7 kg)  04/29/20 207 lb 3.2 oz (94 kg)  Other studies Reviewed: Additional studies/ records that were reviewed today include: ***. Review of the above records demonstrates:  Please see elsewhere in the note.  ***   ASSESSMENT AND PLAN:  HTN:  ***   Current medicines are reviewed at length with the patient today.  The patient {ACTIONS; HAS/DOES NOT HAVE:19233} concerns regarding medicines.  The following changes have been made:  {PLAN; NO CHANGE:13088:s}  Labs/ tests ordered today include: *** No orders of the defined types were placed in this encounter.    Disposition:   FU with ***    Signed, Rollene Rotunda, MD  01/03/2022 8:29 PM    [facility]

## 2022-01-04 ENCOUNTER — Ambulatory Visit: Payer: BC Managed Care – PPO | Attending: Cardiology | Admitting: Cardiology

## 2022-01-04 DIAGNOSIS — I10 Essential (primary) hypertension: Secondary | ICD-10-CM

## 2022-02-05 NOTE — Progress Notes (Deleted)
     Referring-Amy Minette Brine, NP Reason for referral-hypertension and chest pain  HPI: 41 year old male for evaluation of hypertension and chest pain at request of Durene Fruits, NP.  Current Outpatient Medications  Medication Sig Dispense Refill   cyclobenzaprine (FLEXERIL) 10 MG tablet Take 1 tablet (10 mg total) by mouth 3 (three) times daily as needed for muscle spasms. 30 tablet 0   escitalopram (LEXAPRO) 10 MG tablet Take 1 tablet (10 mg total) by mouth daily. 30 tablet 1   hydrOXYzine (VISTARIL) 25 MG capsule Take 1 capsule (25 mg total) by mouth every 8 (eight) hours as needed. 30 capsule 1   ondansetron (ZOFRAN) 4 MG tablet Take 1 tablet (4 mg total) by mouth every 8 (eight) hours as needed for nausea or vomiting. 30 tablet 1   No current facility-administered medications for this visit.    Allergies  Allergen Reactions   Seasonal Ic [Cholestatin]     Running nose     Past Medical History:  Diagnosis Date   Allergy    Anxiety     Past Surgical History:  Procedure Laterality Date   scoliosis  2006   rod   SPINE SURGERY      Social History   Socioeconomic History   Marital status: Married    Spouse name: Not on file   Number of children: 2   Years of education: Not on file   Highest education level: Not on file  Occupational History   Occupation: Visual merchandiser: [employer] [address]  Tobacco Use   Smoking status: Never    Passive exposure: Never   Smokeless tobacco: Never  Substance and Sexual Activity   Alcohol use: Yes    Comment: social   Drug use: No   Sexual activity: Never  Other Topics Concern   Not on file  Social History Narrative   Not on file   Social Determinants of Health   Financial Resource Strain: Not on file  Food Insecurity: Not on file  Transportation Needs: Not on file  Physical Activity: Not on file  Stress: Not on file  Social Connections: Not on file  Intimate Partner Violence: Not  on file    No family history on file.  ROS: no fevers or chills, productive cough, hemoptysis, dysphasia, odynophagia, melena, hematochezia, dysuria, hematuria, rash, seizure activity, orthopnea, PND, pedal edema, claudication. Remaining systems are negative.  Physical Exam:   There were no vitals taken for this visit.  General:  Well developed/well nourished in NAD Skin warm/dry Patient not depressed No peripheral clubbing Back-normal HEENT-normal/normal eyelids Neck supple/normal carotid upstroke bilaterally; no bruits; no JVD; no thyromegaly chest - CTA/ normal expansion CV - RRR/normal S1 and S2; no murmurs, rubs or gallops;  PMI nondisplaced Abdomen -NT/ND, no HSM, no mass, + bowel sounds, no bruit 2+ femoral pulses, no bruits Ext-no edema, chords, 2+ DP Neuro-grossly nonfocal  ECG - personally reviewed  A/P  1 chest pain-  2 hypertension-  Kirk Ruths, MD

## 2022-02-19 ENCOUNTER — Ambulatory Visit: Payer: BC Managed Care – PPO | Attending: Cardiology | Admitting: Cardiology

## 2022-12-06 NOTE — Telephone Encounter (Signed)
See routing comment(s) for message information.

## 2023-07-09 DIAGNOSIS — R519 Headache, unspecified: Secondary | ICD-10-CM | POA: Diagnosis not present

## 2023-07-09 DIAGNOSIS — S0990XA Unspecified injury of head, initial encounter: Secondary | ICD-10-CM | POA: Diagnosis not present

## 2024-02-01 DIAGNOSIS — R350 Frequency of micturition: Secondary | ICD-10-CM | POA: Diagnosis not present

## 2024-02-01 DIAGNOSIS — N390 Urinary tract infection, site not specified: Secondary | ICD-10-CM | POA: Diagnosis not present

## 2024-02-01 DIAGNOSIS — Z113 Encounter for screening for infections with a predominantly sexual mode of transmission: Secondary | ICD-10-CM | POA: Diagnosis not present
